# Patient Record
Sex: Male | Born: 1985 | Race: Black or African American | Hispanic: No | Marital: Single | Smoking: Former smoker
Health system: Southern US, Community
[De-identification: ages and names within clinical notes are randomized; demographics above are authoritative.]

## PROBLEM LIST (undated history)

## (undated) DIAGNOSIS — I10 Essential (primary) hypertension: Secondary | ICD-10-CM

---

## 2018-06-04 ENCOUNTER — Encounter: Payer: Self-pay | Admitting: Emergency Medicine

## 2018-06-04 ENCOUNTER — Emergency Department
Admission: EM | Admit: 2018-06-04 | Discharge: 2018-06-04 | Disposition: A | Discharge status: Home / Self Care | Payer: PRIVATE HEALTH INSURANCE | Attending: Emergency Medicine | Admitting: Emergency Medicine

## 2018-06-04 ENCOUNTER — Other Ambulatory Visit: Payer: Self-pay

## 2018-06-04 DIAGNOSIS — Z87891 Personal history of nicotine dependence: Secondary | ICD-10-CM | POA: Insufficient documentation

## 2018-06-04 DIAGNOSIS — L235 Allergic contact dermatitis due to other chemical products: Secondary | ICD-10-CM | POA: Insufficient documentation

## 2018-06-04 DIAGNOSIS — R21 Rash and other nonspecific skin eruption: Secondary | ICD-10-CM | POA: Diagnosis present

## 2018-06-04 DIAGNOSIS — L299 Pruritus, unspecified: Secondary | ICD-10-CM | POA: Diagnosis not present

## 2018-06-04 MED ORDER — HYDROXYZINE HCL 50 MG PO TABS: 50.0000 mg | ORAL_TABLET | Freq: Three times a day (TID) | ORAL | 0 refills | Status: DC | PRN

## 2018-06-04 MED ORDER — METHYLPREDNISOLONE 4 MG PO TBPK: ORAL_TABLET | ORAL | 0 refills | Status: DC

## 2018-06-04 MED ORDER — PREDNISONE 20 MG PO TABS
60.0000 mg | ORAL_TABLET | Freq: Once | ORAL | Status: AC
  Administered 2018-06-04: 60 mg via ORAL
  Filled 2018-06-04: qty 3

## 2018-06-04 MED ORDER — HYDROXYZINE HCL 50 MG PO TABS
50.0000 mg | ORAL_TABLET | Freq: Once | ORAL | Status: AC
  Administered 2018-06-04: 50 mg via ORAL
  Filled 2018-06-04: qty 1

## 2018-06-04 NOTE — ED Triage Notes (Signed)
PT arrives with concerns over possible allergic reaction. Pt denies throat tightness or abnormality. Pt reports taking benadryl this morning at 1000. Visible swelling noted to pt's face. Rash observed to pt's arms. PT reports right ear pain. Symptoms started yesterday per pt.

## 2018-06-04 NOTE — ED Provider Notes (Signed)
[facility] Emergency Department Provider Note   ____________________________________________   First MD Initiated Contact with Patient 06/04/18 1308     (approximate)  I have reviewed the triage vital signs and the nursing notes.   HISTORY  Chief Complaint Allergic Reaction    HPI Malik Keller is a 33 y.o. male patient presents with diffuse rash which started yesterday.  Patient state he changed laundry products last week.  Patient state intense itching with the rash.  Patient denies signs/symptoms of angioedema.  Patient states took Benadryl this morning with only mild relief.  Patient denies pain just itching.  Patient rates his discomfort a 7/10.  No other palliative measure for complaint.   History reviewed. No pertinent past medical history.  There are no active problems to display for this patient.   History reviewed. No pertinent surgical history.  Prior to Admission medications   Medication Sig Start Date End Date Taking? Authorizing Provider  hydrOXYzine (ATARAX/VISTARIL) 50 MG tablet Take 1 tablet (50 mg total) by mouth 3 (three) times daily as needed. 06/04/18   Joni Reining, PA-C  methylPREDNISolone (MEDROL DOSEPAK) 4 MG TBPK tablet Take Tapered dose as directed 06/04/18   Joni Reining, PA-C    Allergies Patient has no allergy information on record.  No family history on file.  Social History Social History   Tobacco Use  . Smoking status: Former Smoker  Substance Use Topics  . Alcohol use: Never    Frequency: Never  . Drug use: Not on file    Review of Systems Constitutional: No fever/chills Eyes: No visual changes. ENT: No sore throat. Cardiovascular: Denies chest pain. Respiratory: Denies shortness of breath. Gastrointestinal: No abdominal pain.  No nausea, no vomiting.  No diarrhea.  No constipation. Genitourinary: Negative for dysuria. Musculoskeletal: Negative for back pain. Skin: Positive for  rash. Neurological: Negative for headaches, focal weakness or numbness.   ____________________________________________   PHYSICAL EXAM:  VITAL SIGNS: ED Triage Vitals  Enc Vitals Group     BP 06/04/18 1247 (!) 143/80     Pulse Rate 06/04/18 1247 75     Resp --      Temp 06/04/18 1247 97.9 F (36.6 C)     Temp Source 06/04/18 1247 Oral     SpO2 06/04/18 1247 99 %     Weight 06/04/18 1247 185 lb (83.9 kg)     Height 06/04/18 1247 5\' 11"  (1.803 m)     Head Circumference --      Peak Flow --      Pain Score 06/04/18 1251 7     Pain Loc --      Pain Edu? --      Excl. in GC? --    Constitutional: Alert and oriented. Well appearing and in no acute distress. Eyes: Conjunctivae are normal. PERRL. EOMI. Head: Atraumatic. Nose: No congestion/rhinnorhea. Mouth/Throat: Mucous membranes are moist.  Oropharynx non-erythematous.  No oral edema. Neck: No stridor.  Hematological/Lymphatic/Immunilogical: No cervical lymphadenopathy. Cardiovascular: Normal rate, regular rhythm. Grossly normal heart sounds.  Good peripheral circulation. Respiratory: Normal respiratory effort.  No retractions. Lungs CTAB. Neurologic:  Normal speech and language. No gross focal neurologic deficits are appreciated. No gait instability. Skin:  Skin is warm, dry and intact.  Diffuse macular lesions. Psychiatric: Mood and affect are normal. Speech and behavior are normal.  ____________________________________________   LABS (all labs ordered are listed, but only abnormal results are displayed)  Labs Reviewed - No data to display ____________________________________________  EKG   ____________________________________________  RADIOLOGY  ED MD interpretation:    Official radiology report(s): No results found.  ____________________________________________   PROCEDURES  Procedure(s) performed (including Critical Care):  Procedures   ____________________________________________   INITIAL  IMPRESSION / ASSESSMENT AND PLAN / ED COURSE  As part of my medical decision making, I reviewed the following data within the electronic MEDICAL RECORD NUMBER     Patient presents with diffuse rash.  Patient given prednisone and Atarax.  Patient given discharge care instructions and advised return back to ED if condition worsens.      ____________________________________________   FINAL CLINICAL IMPRESSION(S) / ED DIAGNOSES  Final diagnoses:  Allergic dermatitis due to other chemical product     ED Discharge Orders         Ordered    methylPREDNISolone (MEDROL DOSEPAK) 4 MG TBPK tablet     06/04/18 1320    hydrOXYzine (ATARAX/VISTARIL) 50 MG tablet  3 times daily PRN     06/04/18 1320           Note:  This document was prepared using Dragon voice recognition software and may include unintentional dictation errors.    Joni Reining, PA-C 06/04/18 1325    Sharman Cheek, MD 06/04/18 1340

## 2018-06-04 NOTE — ED Notes (Signed)
Pt discharged home after verbalizing understanding of discharge instructions; nad noted. 

## 2018-06-04 NOTE — ED Notes (Signed)
See triage note  Presents with rash and itching for the past 2 days   States he used new detergent prior to rash starting  Also having bilateral ear pain afebrile on arrival

## 2018-06-04 NOTE — ED Notes (Signed)
MD stafford seeing pt in triage

## 2019-01-25 ENCOUNTER — Ambulatory Visit: Payer: PRIVATE HEALTH INSURANCE | Admitting: Family Medicine

## 2019-02-01 ENCOUNTER — Ambulatory Visit: Payer: PRIVATE HEALTH INSURANCE | Admitting: Family Medicine

## 2019-09-03 ENCOUNTER — Encounter: Payer: Self-pay | Admitting: Emergency Medicine

## 2019-09-03 ENCOUNTER — Other Ambulatory Visit: Payer: Self-pay

## 2019-09-03 DIAGNOSIS — R42 Dizziness and giddiness: Secondary | ICD-10-CM | POA: Insufficient documentation

## 2019-09-03 DIAGNOSIS — F17210 Nicotine dependence, cigarettes, uncomplicated: Secondary | ICD-10-CM | POA: Diagnosis not present

## 2019-09-03 LAB — CBC
HCT: 43.6 % (ref 39.0–52.0)
Hemoglobin: 14.6 g/dL (ref 13.0–17.0)
MCH: 29.2 pg (ref 26.0–34.0)
MCHC: 33.5 g/dL (ref 30.0–36.0)
MCV: 87.2 fL (ref 80.0–100.0)
Platelets: 308 10*3/uL (ref 150–400)
RBC: 5 MIL/uL (ref 4.22–5.81)
RDW: 13.3 % (ref 11.5–15.5)
WBC: 10.2 10*3/uL (ref 4.0–10.5)
nRBC: 0 % (ref 0.0–0.2)

## 2019-09-03 LAB — BASIC METABOLIC PANEL
Anion gap: 10 (ref 5–15)
BUN: 8 mg/dL (ref 6–20)
CO2: 22 mmol/L (ref 22–32)
Calcium: 9.8 mg/dL (ref 8.9–10.3)
Chloride: 104 mmol/L (ref 98–111)
Creatinine, Ser: 0.81 mg/dL (ref 0.61–1.24)
GFR calc Af Amer: >60 mL/min (ref >60)
GFR calc non Af Amer: >60 mL/min (ref >60)
Glucose, Bld: 102 mg/dL — ABNORMAL HIGH (ref 70–99)
Potassium: 4 mmol/L (ref 3.5–5.1)
Sodium: 136 mmol/L (ref 135–145)

## 2019-09-03 LAB — URINALYSIS, COMPLETE (UACMP) WITH MICROSCOPIC
Bacteria, UA: NONE SEEN
Bilirubin Urine: NEGATIVE
Glucose, UA: NEGATIVE mg/dL
Hgb urine dipstick: NEGATIVE
Ketones, ur: 20 mg/dL — AB
Leukocytes,Ua: NEGATIVE
Nitrite: NEGATIVE
Protein, ur: NEGATIVE mg/dL
Specific Gravity, Urine: 1.021 (ref 1.005–1.030)
Squamous Epithelial / HPF: NONE SEEN (ref 0–5)
pH: 6 (ref 5.0–8.0)

## 2019-09-03 NOTE — ED Notes (Signed)
Patient to stat desk in no acute distress asking about wait time. Patient given update on wait time. Patient verbalizes understanding.  

## 2019-09-03 NOTE — ED Triage Notes (Signed)
Patient reports while driving to work, he became very dizzy and had to pull to the side of the road. Reports after calming down he was able to finish the drive to work. States he has had intermittent dizziness throughout the day as well as tingling in both hands. Patient also reports nausea. Denies CP or SOB. Denies headache. Denies recent illness.

## 2019-09-03 NOTE — ED Triage Notes (Signed)
Patient states he thinks he has been having panic attacks today. Reports his wife has been sick recently so he has been worried about her. Denies history of same.

## 2019-09-04 ENCOUNTER — Emergency Department
Admission: EM | Admit: 2019-09-04 | Discharge: 2019-09-04 | Disposition: A | Discharge status: Home / Self Care | Payer: 59 | Source: Home / Self Care | Attending: Emergency Medicine | Admitting: Emergency Medicine

## 2019-09-04 ENCOUNTER — Encounter: Payer: Self-pay | Admitting: Emergency Medicine

## 2019-09-04 ENCOUNTER — Other Ambulatory Visit: Payer: Self-pay

## 2019-09-04 ENCOUNTER — Emergency Department
Admission: EM | Admit: 2019-09-04 | Discharge: 2019-09-04 | Disposition: A | Discharge status: Home / Self Care | Payer: 59 | Attending: Emergency Medicine | Admitting: Emergency Medicine

## 2019-09-04 DIAGNOSIS — R42 Dizziness and giddiness: Secondary | ICD-10-CM

## 2019-09-04 LAB — TROPONIN I (HIGH SENSITIVITY)
Troponin I (High Sensitivity): 3 ng/L (ref <18)
Troponin I (High Sensitivity): 2 ng/L (ref <18)

## 2019-09-04 NOTE — ED Provider Notes (Signed)
[facility] Emergency Department Provider Note   ____________________________________________    I have reviewed the triage vital signs and the nursing notes.   HISTORY  Chief Complaint Dizziness     HPI Malik Keller is a 34 y.o. male who presents with complaints of dizziness.  Patient has for 5 episodes over the last 6 months where he is typically driving to work thinking about his day and starts perseverating about the things he has to do, develops sweating in his hands then a feeling of anxiety followed by dizziness.  No chest pain or palpitations.  Currently feels well has no symptoms.  Admits to working 6, 12-hour shifts at work for quite some time.  Does admit to significant stress and anxiety about this.  He thinks that he is having anxiety reactions  History reviewed. No pertinent past medical history.  There are no problems to display for this patient.   History reviewed. No pertinent surgical history.  Prior to Admission medications   Medication Sig Start Date End Date Taking? Authorizing Provider  hydrOXYzine (ATARAX/VISTARIL) 50 MG tablet Take 1 tablet (50 mg total) by mouth 3 (three) times daily as needed. 06/04/18   Joni Reining, PA-C  methylPREDNISolone (MEDROL DOSEPAK) 4 MG TBPK tablet Take Tapered dose as directed 06/04/18   Joni Reining, PA-C     Allergies Patient has no known allergies.  No family history on file.  Social History Social History   Tobacco Use  . Smoking status: Current Every Day Smoker    Packs/day: 0.50  . Smokeless tobacco: Never Used  Substance Use Topics  . Alcohol use: Never  . Drug use: Yes    Types: Marijuana    Review of Systems  Constitutional: No fever/chills Eyes: No visual changes.  ENT: No sore throat. Cardiovascular: Denies chest pain. Respiratory: Denies shortness of breath. Gastrointestinal: No abdominal pain.   Genitourinary: Negative for dysuria. Musculoskeletal: Negative  for back pain. Skin: Negative for rash. Neurological: Negative for headaches   ____________________________________________   PHYSICAL EXAM:  VITAL SIGNS: ED Triage Vitals  Enc Vitals Group     BP 09/04/19 0313 136/86     Pulse Rate 09/04/19 0313 79     Resp 09/04/19 0313 20     Temp 09/04/19 0313 98.3 F (36.8 C)     Temp Source 09/04/19 0313 Oral     SpO2 09/04/19 0313 99 %     Weight 09/04/19 0317 88.5 kg (195 lb 1.7 oz)     Height 09/04/19 0317 1.803 m (5\' 11" )     Head Circumference --      Peak Flow --      Pain Score 09/04/19 0317 0     Pain Loc --      Pain Edu? --      Excl. in GC? --     Constitutional: Alert and oriented.   Nose: No congestion/rhinnorhea. Mouth/Throat: Mucous membranes are moist.    Cardiovascular: Normal rate, regular rhythm.  Good peripheral circulation. Respiratory: Normal respiratory effort.  No retractions.  Genitourinary: deferred Musculoskeletal: .  Warm and well perfused Neurologic:  Normal speech and language. No gross focal neurologic deficits are appreciated.  Skin:  Skin is warm, dry and intact. No rash noted. Psychiatric: Mood and affect are normal. Speech and behavior are normal.  ____________________________________________   LABS (all labs ordered are listed, but only abnormal results are displayed)  Labs Reviewed  TROPONIN I (HIGH SENSITIVITY)  TROPONIN I (  HIGH SENSITIVITY)   ____________________________________________  EKG  ED ECG REPORT I, Lavonia Drafts, the attending physician, personally viewed and interpreted this ECG.  Date: 09/04/2019  Rhythm: normal sinus rhythm QRS Axis: normal Intervals: normal ST/T Wave abnormalities: normal Narrative Interpretation: no evidence of acute ischemia  ____________________________________________  RADIOLOGY  None________________________________________   PROCEDURES  Procedure(s) performed: No  Procedures   Critical Care performed: No  ____________________________________________   INITIAL IMPRESSION / ASSESSMENT AND PLAN / ED COURSE  Pertinent labs & imaging results that were available during my care of the patient were reviewed by me and considered in my medical decision making (see chart for details).  Patient well-appearing in no acute distress, asymptomatic here in the emergency department.  Differential includes anxiety reaction, arrhythmia, vasovagal, EKG is reassuring, negative troponin x2.  Reports multiple episodes of this over the last several months, always on the way to work, never happens outside of the car prior to work.  Does seem consistent with anxiety reaction.  Discussed stress relief options cutting back work, etc.    ____________________________________________   FINAL CLINICAL IMPRESSION(S) / ED DIAGNOSES  Final diagnoses:  Dizziness        Note:  This document was prepared using Dragon voice recognition software and may include unintentional dictation errors.   Lavonia Drafts, MD 09/04/19 [phone]

## 2019-09-04 NOTE — ED Notes (Signed)
Pt ambulatory to Stat desk stating that he will come back in the AM. Pt ambulatory out of ED with steady gait and in NAD.

## 2019-09-04 NOTE — ED Triage Notes (Signed)
Pt arrives ambulatory to triage with c/o dizziness. Pt was here earlier and LWBS due to wait time. Pt states that he "just wasn't able to go to sleep" so he came back. Pt is in NAD.

## 2019-09-04 NOTE — ED Notes (Signed)
ED Provider at bedside. 

## 2019-09-08 ENCOUNTER — Encounter: Payer: Self-pay | Admitting: Emergency Medicine

## 2019-09-08 ENCOUNTER — Other Ambulatory Visit: Payer: Self-pay

## 2019-09-08 ENCOUNTER — Emergency Department
Admission: EM | Admit: 2019-09-08 | Discharge: 2019-09-08 | Disposition: A | Discharge status: Home / Self Care | Payer: 59 | Attending: Student in an Organized Health Care Education/Training Program | Admitting: Student in an Organized Health Care Education/Training Program

## 2019-09-08 DIAGNOSIS — Z733 Stress, not elsewhere classified: Secondary | ICD-10-CM | POA: Diagnosis present

## 2019-09-08 DIAGNOSIS — F410 Panic disorder [episodic paroxysmal anxiety] without agoraphobia: Secondary | ICD-10-CM | POA: Insufficient documentation

## 2019-09-08 DIAGNOSIS — F1210 Cannabis abuse, uncomplicated: Secondary | ICD-10-CM | POA: Diagnosis not present

## 2019-09-08 DIAGNOSIS — Z566 Other physical and mental strain related to work: Secondary | ICD-10-CM | POA: Diagnosis not present

## 2019-09-08 DIAGNOSIS — R202 Paresthesia of skin: Secondary | ICD-10-CM | POA: Diagnosis not present

## 2019-09-08 DIAGNOSIS — F17210 Nicotine dependence, cigarettes, uncomplicated: Secondary | ICD-10-CM | POA: Insufficient documentation

## 2019-09-08 LAB — BASIC METABOLIC PANEL
Anion gap: 9 (ref 5–15)
BUN: 8 mg/dL (ref 6–20)
CO2: 26 mmol/L (ref 22–32)
Calcium: 9.4 mg/dL (ref 8.9–10.3)
Chloride: 102 mmol/L (ref 98–111)
Creatinine, Ser: 0.89 mg/dL (ref 0.61–1.24)
GFR calc Af Amer: >60 mL/min (ref >60)
GFR calc non Af Amer: >60 mL/min (ref >60)
Glucose, Bld: 131 mg/dL — ABNORMAL HIGH (ref 70–99)
Potassium: 4 mmol/L (ref 3.5–5.1)
Sodium: 137 mmol/L (ref 135–145)

## 2019-09-08 LAB — CBC
HCT: 43.2 % (ref 39.0–52.0)
Hemoglobin: 14.3 g/dL (ref 13.0–17.0)
MCH: 29.1 pg (ref 26.0–34.0)
MCHC: 33.1 g/dL (ref 30.0–36.0)
MCV: 88 fL (ref 80.0–100.0)
Platelets: 295 10*3/uL (ref 150–400)
RBC: 4.91 MIL/uL (ref 4.22–5.81)
RDW: 13.3 % (ref 11.5–15.5)
WBC: 7.3 10*3/uL (ref 4.0–10.5)
nRBC: 0 % (ref 0.0–0.2)

## 2019-09-08 LAB — TSH: TSH: 0.957 u[IU]/mL (ref 0.350–4.500)

## 2019-09-08 MED ORDER — LORAZEPAM 0.5 MG PO TABS: 0.5000 mg | ORAL_TABLET | Freq: Three times a day (TID) | ORAL | 0 refills | Status: DC | PRN

## 2019-09-08 MED ORDER — LORAZEPAM 1 MG PO TABS
1.0000 mg | ORAL_TABLET | Freq: Once | ORAL | Status: AC
  Administered 2019-09-08: 1 mg via ORAL
  Filled 2019-09-08: qty 1

## 2019-09-08 NOTE — ED Notes (Signed)
Blood work: full rainbow drawn in triage.

## 2019-09-08 NOTE — ED Provider Notes (Signed)
[facility] Emergency Department Provider Note    First MD Initiated Contact with Patient 09/08/19 1214     (approximate)  I have reviewed the triage vital signs and the nursing notes.   HISTORY  Chief Complaint Dizziness and Chills    HPI Malik Keller is a 34 y.o. male presents to the ER for concern over stress and panic attacks.  Was seen for similar symptoms last Friday with reassuring work-up.  States that he is frequently having episodes where he has bilateral tingling overwhelming sense of doom, racing thoughts particular when he is driving home.  States that over the past several nights has been waking up with racing thoughts as well.  Denies any SI or HI.  Denies any history of anxiety or depression does not admit to very stressful work environment.  States his wife is also undergoing quite a bit of stress.  Is not on any sort of medication for this.    History reviewed. No pertinent past medical history. No family history on file. History reviewed. No pertinent surgical history. There are no problems to display for this patient.     Prior to Admission medications   Medication Sig Start Date End Date Taking? Authorizing Provider  LORazepam (ATIVAN) 0.5 MG tablet Take 1 tablet (0.5 mg total) by mouth every 8 (eight) hours as needed for anxiety. 09/08/19 09/07/20  Merlyn Lot, MD    Allergies Patient has no known allergies.    Social History Social History   Tobacco Use  . Smoking status: Current Every Day Smoker    Packs/day: 0.50  . Smokeless tobacco: Never Used  Substance Use Topics  . Alcohol use: Never  . Drug use: Yes    Types: Marijuana    Review of Systems Patient denies headaches, rhinorrhea, blurry vision, numbness, shortness of breath, chest pain, edema, cough, abdominal pain, nausea, vomiting, diarrhea, dysuria, fevers, rashes or hallucinations unless otherwise stated above in  HPI. ____________________________________________   PHYSICAL EXAM:  VITAL SIGNS: Vitals:   09/08/19 0828  BP: 124/86  Pulse: 83  Resp: 20  Temp: 98.9 F (37.2 C)  SpO2: 97%    Constitutional: Alert and oriented.  Eyes: Conjunctivae are normal.  Head: Atraumatic. Nose: No congestion/rhinnorhea. Mouth/Throat: Mucous membranes are moist.   Neck: No stridor. Painless ROM.  Cardiovascular: Normal rate, regular rhythm. Grossly normal heart sounds.  Good peripheral circulation. Respiratory: Normal respiratory effort.  No retractions. Lungs CTAB. Gastrointestinal: Soft and nontender. No distention. No abdominal bruits. No CVA tenderness. Genitourinary:  Musculoskeletal: No lower extremity tenderness nor edema.  No joint effusions. Neurologic:  Normal speech and language. No gross focal neurologic deficits are appreciated. No facial droop Skin:  Skin is warm, dry and intact. No rash noted. Psychiatric: Mood and affect are normal. Speech and behavior are normal.  ____________________________________________   LABS (all labs ordered are listed, but only abnormal results are displayed)  Results for orders placed or performed during the hospital encounter of 09/08/19 (from the past 24 hour(s))  Basic metabolic panel     Status: Abnormal   Collection Time: 09/08/19  8:39 AM  Result Value Ref Range   Sodium 137 135 - 145 mmol/L   Potassium 4.0 3.5 - 5.1 mmol/L   Chloride 102 98 - 111 mmol/L   CO2 26 22 - 32 mmol/L   Glucose, Bld 131 (H) 70 - 99 mg/dL   BUN 8 6 - 20 mg/dL   Creatinine, Ser 0.89 0.61 - 1.24 mg/dL  Calcium 9.4 8.9 - 10.3 mg/dL   GFR calc non Af Amer >60 >60 mL/min   GFR calc Af Amer >60 >60 mL/min   Anion gap 9 5 - 15  CBC     Status: None   Collection Time: 09/08/19  8:39 AM  Result Value Ref Range   WBC 7.3 4.0 - 10.5 K/uL   RBC 4.91 4.22 - 5.81 MIL/uL   Hemoglobin 14.3 13.0 - 17.0 g/dL   HCT 81.4 48.1 - 85.6 %   MCV 88.0 80.0 - 100.0 fL   MCH 29.1 26.0 -  34.0 pg   MCHC 33.1 30.0 - 36.0 g/dL   RDW 31.4 97.0 - 26.3 %   Platelets 295 150 - 400 K/uL   nRBC 0.0 0.0 - 0.2 %  TSH     Status: None   Collection Time: 09/08/19  8:39 AM  Result Value Ref Range   TSH 0.957 0.350 - 4.500 uIU/mL   ____________________________________________  EKG My review and personal interpretation at Time: 8:22   Indication: dizziness  Rate: 65  Rhythm: sinus Axis: normal Other: normal intervals, no stemi ____________________________________________  ____________________________________________   PROCEDURES  Procedure(s) performed:  Procedures    Critical Care performed: no ____________________________________________   INITIAL IMPRESSION / ASSESSMENT AND PLAN / ED COURSE  Pertinent labs & imaging results that were available during my care of the patient were reviewed by me and considered in my medical decision making (see chart for details).   DDX: Adjustment or stress reaction, panic, hypothyroid, electrolyte abnormality  Malik Keller is a 34 y.o. who presents to the ED with symptoms as described above.  Patient is anxious appearing but nontoxic.  Had reassuring work-up just this past Friday.  Denies any pain.  No focal neuro deficits.  Does not seem consistent with vertigo.  Seems to be primarily related to stress at work.  No LOC.  EKG without any evidence of ischemia or dysrhythmia.  Will check thyroid.  Will give Ativan.  We discussed coping mechanisms need for outpatient follow-up.  Will reassess.  Clinical Course as of Sep 08 1318  Wed Sep 08, 2019  1319 With significant improvement after Ativan.  Work-up here is reassuring.  Do feel he is appropriate for outpatient follow-up.  Discussed signs symptoms which he should return to the ER.   [PR]    Clinical Course User Index [PR] Willy Eddy, MD    The patient was evaluated in Emergency Department today for the symptoms described in the history of present illness. He/she was  evaluated in the context of the global COVID-19 pandemic, which necessitated consideration that the patient might be at risk for infection with the SARS-CoV-2 virus that causes COVID-19. Institutional protocols and algorithms that pertain to the evaluation of patients at risk for COVID-19 are in a state of rapid change based on information released by regulatory bodies including the CDC and federal and state organizations. These policies and algorithms were followed during the patient's care in the ED.  As part of my medical decision making, I reviewed the following data within the electronic MEDICAL RECORD NUMBER Nursing notes reviewed and incorporated, Labs reviewed, notes from prior ED visits and [place] Controlled Substance Database   ____________________________________________   FINAL CLINICAL IMPRESSION(S) / ED DIAGNOSES  Final diagnoses:  Stress at work      NEW MEDICATIONS STARTED DURING THIS VISIT:  New Prescriptions   LORAZEPAM (ATIVAN) 0.5 MG TABLET    Take 1 tablet (0.5 mg total)  by mouth every 8 (eight) hours as needed for anxiety.     Note:  This document was prepared using Dragon voice recognition software and may include unintentional dictation errors.    Willy Eddy, MD 09/08/19 1320

## 2019-09-08 NOTE — ED Notes (Signed)
See triage note  States he became dizzy while driving this am   Also had chills and felt like he was going to pass out  States was seen last Saturday for same  Denies any n/v fever or diarrhea

## 2019-09-08 NOTE — ED Triage Notes (Signed)
Pt reports dizziness, chills and feeling like he is going to faint. Pt reports was here for the same a few days ago and was told it was stress related.

## 2019-10-28 ENCOUNTER — Ambulatory Visit: Payer: PRIVATE HEALTH INSURANCE | Admitting: Internal Medicine

## 2019-10-28 DIAGNOSIS — Z0289 Encounter for other administrative examinations: Secondary | ICD-10-CM

## 2019-12-22 ENCOUNTER — Other Ambulatory Visit: Payer: Self-pay

## 2019-12-22 ENCOUNTER — Encounter: Payer: Self-pay | Admitting: *Deleted

## 2019-12-22 ENCOUNTER — Emergency Department
Admission: EM | Admit: 2019-12-22 | Discharge: 2019-12-22 | Disposition: A | Discharge status: Home / Self Care | Payer: 59 | Attending: Emergency Medicine | Admitting: Emergency Medicine

## 2019-12-22 ENCOUNTER — Emergency Department: Payer: 59

## 2019-12-22 DIAGNOSIS — Z20822 Contact with and (suspected) exposure to covid-19: Secondary | ICD-10-CM | POA: Insufficient documentation

## 2019-12-22 DIAGNOSIS — R0602 Shortness of breath: Secondary | ICD-10-CM | POA: Insufficient documentation

## 2019-12-22 DIAGNOSIS — Z87891 Personal history of nicotine dependence: Secondary | ICD-10-CM | POA: Diagnosis not present

## 2019-12-22 DIAGNOSIS — R002 Palpitations: Secondary | ICD-10-CM | POA: Insufficient documentation

## 2019-12-22 LAB — BASIC METABOLIC PANEL
Anion gap: 11 (ref 5–15)
BUN: 13 mg/dL (ref 6–20)
CO2: 24 mmol/L (ref 22–32)
Calcium: 9.7 mg/dL (ref 8.9–10.3)
Chloride: 101 mmol/L (ref 98–111)
Creatinine, Ser: 0.84 mg/dL (ref 0.61–1.24)
GFR calc Af Amer: >60 mL/min (ref >60)
GFR calc non Af Amer: >60 mL/min (ref >60)
Glucose, Bld: 109 mg/dL — ABNORMAL HIGH (ref 70–99)
Potassium: 4 mmol/L (ref 3.5–5.1)
Sodium: 136 mmol/L (ref 135–145)

## 2019-12-22 LAB — TROPONIN I (HIGH SENSITIVITY): Troponin I (High Sensitivity): 4 ng/L (ref <18)

## 2019-12-22 LAB — CBC
HCT: 41.3 % (ref 39.0–52.0)
Hemoglobin: 14 g/dL (ref 13.0–17.0)
MCH: 29.4 pg (ref 26.0–34.0)
MCHC: 33.9 g/dL (ref 30.0–36.0)
MCV: 86.6 fL (ref 80.0–100.0)
Platelets: 294 10*3/uL (ref 150–400)
RBC: 4.77 MIL/uL (ref 4.22–5.81)
RDW: 13.4 % (ref 11.5–15.5)
WBC: 13.8 10*3/uL — ABNORMAL HIGH (ref 4.0–10.5)
nRBC: 0 % (ref 0.0–0.2)

## 2019-12-22 LAB — SARS CORONAVIRUS 2 BY RT PCR (HOSPITAL ORDER, PERFORMED IN ~~LOC~~ HOSPITAL LAB): SARS Coronavirus 2: NEGATIVE

## 2019-12-22 NOTE — ED Provider Notes (Signed)
[facility] Emergency Department Provider Note ____________________________________________   First MD Initiated Contact with Patient 12/22/19 [phone]     (approximate)  I have reviewed the triage vital signs and the nursing notes.  HISTORY  Chief Complaint Shortness of Breath   HPI Malik Keller is a 34 y.o. malewho presents to the ED for evaluation of shortness of breath and palpitations.  Chart review indicates history of anxiety.  Patient reports developing a sensation of shortness of breath on the way to work yesterday morning.  He reports no shortness of breath while working, just a sensation of presyncopal lightheaded dizziness while standing that self resolves.  Reports 3-developing the sensation of shortness of breath on the way home from work last night, so he immediately presented to the ED for evaluation.  Patient has been waiting in the waiting room for approximately 8 hours prior to my evaluation.  Within that timeframe, patient denies any chest pain or shortness of breath.  He reports feeling "okay" right now, "just worried."  Patient denies ever having had chest pain throughout these past few days.  He does report associated heart palpitations with his shortness of breath.  He reports recently receiving the first dose of mRNA COVID-19 vaccination 2 weeks ago, with his third dose upcoming later this week.    No past medical history on file.  There are no problems to display for this patient.   No past surgical history on file.  Prior to Admission medications   Medication Sig Start Date End Date Taking? Authorizing Provider  LORazepam (ATIVAN) 0.5 MG tablet Take 1 tablet (0.5 mg total) by mouth every 8 (eight) hours as needed for anxiety. 09/08/19 09/07/20  Willy Eddy, MD    Allergies Patient has no known allergies.  No family history on file.  Social History Social History   Tobacco Use  . Smoking status: Former Smoker     Packs/day: 0.50  . Smokeless tobacco: Never Used  Substance Use Topics  . Alcohol use: Never  . Drug use: Yes    Types: Marijuana    Review of Systems  Constitutional: No fever/chills Eyes: No visual changes. ENT: No sore throat. Cardiovascular: Denies chest pain.  Positive for palpitations. Respiratory: Positive shortness of breath. Gastrointestinal: No abdominal pain.  No nausea, no vomiting.  No diarrhea.  No constipation. Genitourinary: Negative for dysuria. Musculoskeletal: Negative for back pain. Skin: Negative for rash. Neurological: Negative for headaches, focal weakness or numbness.   ____________________________________________   PHYSICAL EXAM:  VITAL SIGNS: Vitals:   12/22/19 0058 12/22/19 0352  BP: 129/80 132/78  Pulse: 70 (!) 55  Resp: 18 16  Temp: 98.6 F (37 C) 99 F (37.2 C)  SpO2: 99% 99%      Constitutional: Alert and oriented. Well appearing and in no acute distress. Eyes: Conjunctivae are normal. PERRL. EOMI. Head: Atraumatic. Nose: No congestion/rhinnorhea. Mouth/Throat: Mucous membranes are moist.  Oropharynx non-erythematous. Neck: No stridor. No cervical spine tenderness to palpation. Cardiovascular: Normal rate, regular rhythm. Grossly normal heart sounds.  Good peripheral circulation. Respiratory: Normal respiratory effort.  No retractions. Lungs CTAB. Gastrointestinal: Soft , nondistended, nontender to palpation. No abdominal bruits. No CVA tenderness. Musculoskeletal: No lower extremity tenderness nor edema.  No joint effusions. No signs of acute trauma. Neurologic:  Normal speech and language. No gross focal neurologic deficits are appreciated. No gait instability noted. Skin:  Skin is warm, dry and intact. No rash noted. Psychiatric: Mood and affect are normal. Speech and behavior  are normal.  ____________________________________________   LABS (all labs ordered are listed, but only abnormal results are displayed)  Labs Reviewed   BASIC METABOLIC PANEL - Abnormal; Notable for the following components:      Result Value   Glucose, Bld 109 (*)    All other components within normal limits  CBC - Abnormal; Notable for the following components:   WBC 13.8 (*)    All other components within normal limits  SARS CORONAVIRUS 2 BY RT PCR (HOSPITAL ORDER, PERFORMED IN  HOSPITAL LAB)  TROPONIN I (HIGH SENSITIVITY)   ____________________________________________  12 Lead EKG  Sinus rhythm, rate of 60 bpm, normal axis and intervals.  No evidence of acute ischemia. ____________________________________________  RADIOLOGY  ED MD interpretation: 2 view CXR reviewed without evidence of acute cardiopulmonary pathology.  Official radiology report(s): DG Chest 2 View  Result Date: 12/22/2019 CLINICAL DATA:  Shortness of breath EXAM: CHEST - 2 VIEW COMPARISON:  None. FINDINGS: The heart size and mediastinal contours are within normal limits. Both lungs are clear. The visualized skeletal structures are unremarkable. IMPRESSION: No active cardiopulmonary disease. Electronically Signed   By: Jasmine Pang M.D.   On: 12/22/2019 01:45   ____________________________________________   MDM / ED COURSE  Otherwise healthy 34 year old male with a history of anxiety presenting with resolved subjective sensation of shortness of breath, without evidence of acute pathology, and amenable to outpatient management.  Normal vital signs on room air.  Exam without evidence of acute derangements.  Patient looks well, has no acute distress, signs of trauma or neurovascular deficits.  He is asymptomatic here in the ED.  Unremarkable blood work.  CXR without infiltrates or changes with viral infection.  EKG is nonischemic and troponin is negative.  He has already had his first dose of COVID-19 vaccination, though is concerned over the possibility of infection prior to full vaccination.  Due to this, patient was swabbed and advised to check his  results at home and quarantine until he has a negative result.  We discussed outpatient management and return precautions for the ED.  Patient medically stable for discharge home.  ____________________________________________   FINAL CLINICAL IMPRESSION(S) / ED DIAGNOSES  Final diagnoses:  SOB (shortness of breath)     ED Discharge Orders    None       Ras Kollman   Note:  This document was prepared using Dragon voice recognition software and may include unintentional dictation errors.   Delton [place], MD 12/22/19 [phone]

## 2019-12-22 NOTE — ED Notes (Signed)
See triage note Presents with some SOB and cough    Low grade temp on arrival

## 2019-12-22 NOTE — Discharge Instructions (Signed)
Please take Tylenol and ibuprofen/Advil for your pain or fever.  It is safe to take them together, or to alternate them every few hours.  Take up to 1000mg  of Tylenol at a time, up to 4 times per day.  Do not take more than 4000 mg of Tylenol in 24 hours.  For ibuprofen, take 400-600 mg, 4-5 times per day.  Please see the associated instructions on how to set up a MyChart account to review your Covid test results.  If you are negative for COVID-19 on yourr swab, please get the second vaccine as scheduled. If you are positive, please stay at home quarantined and wear a mask around others until you are feeling better, once testing negative then get your second shot.  Return to the ED with any worsening symptoms.

## 2019-12-22 NOTE — ED Triage Notes (Signed)
Pt reports sob since this am.  No cough or fever.  No chest pain.  No n/v/d   Pt reports a dry mouth.  Pt alert.

## 2020-02-27 DIAGNOSIS — R0602 Shortness of breath: Secondary | ICD-10-CM | POA: Insufficient documentation

## 2020-02-27 DIAGNOSIS — R002 Palpitations: Secondary | ICD-10-CM | POA: Insufficient documentation

## 2020-02-27 NOTE — Progress Notes (Signed)
Cardiology Office Note  Date:  02/28/2020   ID:  Malik Keller, DOB 12/28/1985, MRN [id number]  PCP:  Pcp, No   Chief Complaint  Patient presents with  . New Patient (Initial Visit)    Self-referral for chest pain  States he has been having CP for 4-5 months--feels like heart burn; previous provider believes it is anxiety. Medications do not seem to be working. States CP lasts sometimes all day, on left side of chest. States burning pain is accompanied by SOB and numbness in left arm and leg--went to ED for this. They said it was stress related. Pt states he is not really dealing with anything stressful right now. States he feels his heart racing occasionally.    HPI:  Malik Keller is a 34 year old gentleman with past medical history of Anxiety Recently seen in the emergency room for shortness of breath and palpitations Referred by the emergency room for shortness of breath  Multiple trips to the emergency room for anxiety, before episodes of severe anxiety leading to ER evaluation in 2021 Most recently December 22, 2019 seen in the emergency room Numbness in his left arm, was short of breath, Symptoms were better by the time he was evaluated in the emergency room  Each of his visits to the ER labs are normal including cardiac enzymes, EKG unchanged  Left side numb while driving to work Pulled over, sx better in 25 min Also some palpitations, Went to to work, GERD  Off work couple days Checked into "mood clinic" Details unavailable, started on numerous medications for anxiety  Reports when he has an anxiety attack he takes clonazepam and propranolol Does not check his blood pressure routinely Has had high blood pressure in the setting of anxiety, takes amlodipine losartan propranolol  EKG personally reviewed by myself on todays visit Shows normal sinus rhythm with rate 63 bpm no significant ST-T wave changes  PMH:   has no past medical history on file.  PSH:   No  past surgical history on file.  Current Outpatient Medications  Medication Sig Dispense Refill  . amLODipine (NORVASC) 5 MG tablet Take 5 mg by mouth daily.    Marland Kitchen buPROPion (WELLBUTRIN) 75 MG tablet Take 75 mg by mouth 2 (two) times daily.    . busPIRone (BUSPAR) 5 MG tablet Take 5 mg by mouth 2 (two) times daily.    . clindamycin (CLEOCIN T) 1 % external solution Apply topically in the morning and at bedtime.    . clonazePAM (KLONOPIN) 0.5 MG tablet Take 0.5 mg by mouth 2 (two) times daily as needed for anxiety.    Marland Kitchen doxycycline (VIBRA-TABS) 100 MG tablet Take by mouth in the morning, at noon, and at bedtime.    . fluocinonide (LIDEX) 0.05 % external solution Apply topically in the morning and at bedtime.    Marland Kitchen FLUoxetine (PROZAC) 40 MG capsule Take 40 mg by mouth daily.    Marland Kitchen losartan (COZAAR) 50 MG tablet Take 50 mg by mouth daily.    . meclizine (ANTIVERT) 25 MG tablet Take 25 mg by mouth as needed for dizziness.    . mirtazapine (REMERON) 30 MG tablet Take 30 mg by mouth at bedtime.    Marland Kitchen omeprazole (PRILOSEC) 40 MG capsule Take 40 mg by mouth daily.    . propranolol (INDERAL) 20 MG tablet Take 20 mg by mouth 3 (three) times daily.    . traZODone (DESYREL) 50 MG tablet Take 50 mg by mouth at bedtime.  No current facility-administered medications for this visit.     Allergies:   Patient has no known allergies.   Social History:  The patient  reports that he has quit smoking. He smoked 0.50 packs per day. He has never used smokeless tobacco. He reports current drug use. Drug: Marijuana. He reports that he does not drink alcohol.   Family History:   family history is not on file.    Review of Systems: Review of Systems  Constitutional: Negative.   HENT: Negative.   Respiratory: Negative.   Cardiovascular: Negative.   Gastrointestinal: Negative.   Musculoskeletal: Negative.   Neurological: Negative.   Psychiatric/Behavioral: Negative.   All other systems reviewed and are  negative.   PHYSICAL EXAM: VS:  BP 108/72   Pulse 63   Ht 5\' 11"  (1.803 m)   Wt 192 lb (87.1 kg)   BMI 26.78 kg/m  , BMI Body mass index is 26.78 kg/m. GEN: Well nourished, well developed, in no acute distress HEENT: normal Neck: no JVD, carotid bruits, or masses Cardiac: RRR; no murmurs, rubs, or gallops,no edema  Respiratory:  clear to auscultation bilaterally, normal work of breathing GI: soft, nontender, nondistended, + BS MS: no deformity or atrophy Skin: warm and dry, no rash Neuro:  Strength and sensation are intact Psych: euthymic mood, full affect   Recent Labs: 09/08/2019: TSH 0.957 12/22/2019: BUN 13; Creatinine, Ser 0.84; Hemoglobin 14.0; Platelets 294; Potassium 4.0; Sodium 136    Lipid Panel No results found for: CHOL, HDL, LDLCALC, TRIG    Wt Readings from Last 3 Encounters:  02/28/20 192 lb (87.1 kg)  12/22/19 182 lb (82.6 kg)  09/08/19 195 lb (88.5 kg)       ASSESSMENT AND PLAN:  Problem List Items Addressed This Visit    SOB (shortness of breath)   Palpitations     Palpitations Secondary to anxiety, We did discuss ZIO monitor if symptoms get worse He does report dramatic improvement in his symptoms on his current anxiety regiment which also includes clonazepam and propranolol He feels comfortable waiting on the monitor for now but will call 11/08/19 if symptoms get worse  Shortness of breath Reports he is able to exercise several miles at a time on his home exercise machine with no symptoms Shortness of breath symptoms in the setting of anxiety He has had improved with anxiety medications No further work-up at this time though we did discuss echocardiogram if symptoms get worse    Total encounter time more than 25 minutes  Greater than 50% was spent in counseling and coordination of care with the patient    Signed, [place], M.D., Ph.D. [facility] [place], [place]

## 2020-02-28 ENCOUNTER — Ambulatory Visit (INDEPENDENT_AMBULATORY_CARE_PROVIDER_SITE_OTHER): Payer: 59 | Admitting: Cardiovascular Disease

## 2020-02-28 ENCOUNTER — Encounter: Payer: Self-pay | Admitting: Cardiovascular Disease

## 2020-02-28 ENCOUNTER — Other Ambulatory Visit: Payer: Self-pay

## 2020-02-28 VITALS — BP 108/72 | HR 63 | Ht 71.0 in | Wt 192.0 lb

## 2020-02-28 DIAGNOSIS — R0602 Shortness of breath: Secondary | ICD-10-CM

## 2020-02-28 DIAGNOSIS — R002 Palpitations: Secondary | ICD-10-CM

## 2020-02-28 DIAGNOSIS — F419 Anxiety disorder, unspecified: Secondary | ICD-10-CM

## 2020-02-28 NOTE — Patient Instructions (Addendum)
If rhythm gets bad, call the office We can order a ZIO monitor And maybe echocardiogram  Medication Instructions:  For heart burn, take tums and pepcid as needed   If you need a refill on your cardiac medications before your next appointment, please call your pharmacy.    Lab work: No new labs needed   If you have labs (blood work) drawn today and your tests are completely normal, you will receive your results only by: Marland Kitchen MyChart Message (if you have MyChart) OR . A paper copy in the mail If you have any lab test that is abnormal or we need to change your treatment, we will call you to review the results.   Testing/Procedures: No new testing needed   Follow-Up: At [facility], you and your health needs are our priority.  As part of our continuing mission to provide you with exceptional heart care, we have created designated Provider Care Teams.  These Care Teams include your primary Cardiologist (physician) and Advanced Practice Providers (APPs -  Physician Assistants and Nurse Practitioners) who all work together to provide you with the care you need, when you need it.  . You will need a follow up appointment as needed  . Providers on your designated Care Team:   . Nicolasa Ducking, NP . Eula Listen, PA-C . Marisue Ivan, PA-C  Any Other Special Instructions Will Be Listed Below (If Applicable).  COVID-19 Vaccine Information can be found at: [email or web address] For questions related to vaccine distribution or appointments, please email [email or web address] or call [phone].

## 2020-04-02 ENCOUNTER — Other Ambulatory Visit: Payer: Self-pay

## 2020-04-02 ENCOUNTER — Encounter: Payer: Self-pay | Admitting: Emergency Medicine

## 2020-04-02 ENCOUNTER — Emergency Department
Admission: EM | Admit: 2020-04-02 | Discharge: 2020-04-02 | Disposition: A | Discharge status: Home / Self Care | Payer: PRIVATE HEALTH INSURANCE | Attending: Emergency Medicine | Admitting: Emergency Medicine

## 2020-04-02 DIAGNOSIS — Z79899 Other long term (current) drug therapy: Secondary | ICD-10-CM | POA: Insufficient documentation

## 2020-04-02 DIAGNOSIS — Z87891 Personal history of nicotine dependence: Secondary | ICD-10-CM | POA: Insufficient documentation

## 2020-04-02 DIAGNOSIS — I10 Essential (primary) hypertension: Secondary | ICD-10-CM | POA: Insufficient documentation

## 2020-04-02 HISTORY — DX: Essential (primary) hypertension: I10

## 2020-04-02 NOTE — ED Provider Notes (Signed)
[facility] Emergency Department Provider Note  ____________________________________________   Event Date/Time   First MD Initiated Contact with Patient 04/02/20 1050     (approximate)  I have reviewed the triage vital signs and the nursing notes.   HISTORY  Chief Complaint Hypertension    HPI Malik Keller is a 34 y.o. male presents emergency department with concerns of hypertension for 2 days.  Patient states that his blood pressure was 166/109 prior to taking his medication this morning.  He also admits to eating pork ribs the other night.  Patient also picked up his son 2 days ago.  He denies chest pain or shortness of breath this time.  He does have a cardiologist and follows up with him regularly.    Past Medical History:  Diagnosis Date  . Hypertension     Patient Active Problem List   Diagnosis Date Noted  . SOB (shortness of breath) 02/27/2020  . Palpitations 02/27/2020    History reviewed. No pertinent surgical history.  Prior to Admission medications   Medication Sig Start Date End Date Taking? Authorizing Provider  amLODipine (NORVASC) 5 MG tablet Take 5 mg by mouth daily. 02/22/20   [provider]  buPROPion (WELLBUTRIN) 75 MG tablet Take 75 mg by mouth 2 (two) times daily.    [provider]  busPIRone (BUSPAR) 5 MG tablet Take 5 mg by mouth 2 (two) times daily. 02/21/20   [provider]  clindamycin (CLEOCIN T) 1 % external solution Apply topically in the morning and at bedtime. 02/16/20   [provider]  clonazePAM (KLONOPIN) 0.5 MG tablet Take 0.5 mg by mouth 2 (two) times daily as needed for anxiety.    [provider]  doxycycline (VIBRA-TABS) 100 MG tablet Take by mouth in the morning, at noon, and at bedtime. 02/16/20   [provider]  fluocinonide (LIDEX) 0.05 % external solution Apply topically in the morning and at bedtime. 02/16/20   [provider]   FLUoxetine (PROZAC) 40 MG capsule Take 40 mg by mouth daily.    [provider]  losartan (COZAAR) 50 MG tablet Take 50 mg by mouth daily. 02/22/20   [provider]  meclizine (ANTIVERT) 25 MG tablet Take 25 mg by mouth as needed for dizziness.    [provider]  mirtazapine (REMERON) 30 MG tablet Take 30 mg by mouth at bedtime. 02/14/20   [provider]  omeprazole (PRILOSEC) 40 MG capsule Take 40 mg by mouth daily. 02/15/20   [provider]  propranolol (INDERAL) 20 MG tablet Take 20 mg by mouth 3 (three) times daily. 02/22/20   [provider]  traZODone (DESYREL) 50 MG tablet Take 50 mg by mouth at bedtime. 02/13/20   [provider]    Allergies Patient has no known allergies.  No family history on file.  Social History Social History   Tobacco Use  . Smoking status: Former Smoker    Packs/day: 0.50  . Smokeless tobacco: Never Used  Substance Use Topics  . Alcohol use: Never  . Drug use: Yes    Types: Marijuana    Review of Systems  Constitutional: No fever/chills Eyes: No visual changes. ENT: No sore throat. Respiratory: Denies cough Cardiovascular: Denies chest pain Gastrointestinal: Denies abdominal pain Genitourinary: Negative for dysuria. Musculoskeletal: Negative for back pain. Skin: Negative for rash. Psychiatric: no mood changes,     ____________________________________________   PHYSICAL EXAM:  VITAL SIGNS: ED Triage Vitals  Enc  Vitals Group     BP 04/02/20 1003 (!) 145/91     Pulse Rate 04/02/20 1003 64     Resp 04/02/20 1003 16     Temp 04/02/20 1003 98.7 F (37.1 C)     Temp Source 04/02/20 1003 Oral     SpO2 04/02/20 1003 98 %     Weight 04/02/20 0955 180 lb (81.6 kg)     Height 04/02/20 0955 5\' 11"  (1.803 m)     Head Circumference --      Peak Flow --      Pain Score 04/02/20 0954 0     Pain Loc --      Pain Edu? --      Excl. in GC? --     Constitutional: Alert and  oriented. Well appearing and in no acute distress. Eyes: Conjunctivae are normal.  Head: Atraumatic. Nose: No congestion/rhinnorhea. Mouth/Throat: Mucous membranes are moist.   Neck:  supple no lymphadenopathy noted Cardiovascular: Normal rate, regular rhythm. Heart sounds are normal Respiratory: Normal respiratory effort.  No retractions, lungs c t a  GU: deferred Musculoskeletal: FROM all extremities, warm and well perfused Neurologic:  Normal speech and language.  Skin:  Skin is warm, dry and intact. No rash noted. Psychiatric: Mood and affect are normal. Speech and behavior are normal.  ____________________________________________   LABS (all labs ordered are listed, but only abnormal results are displayed)  Labs Reviewed - No data to display ____________________________________________   ____________________________________________  RADIOLOGY    ____________________________________________   PROCEDURES  Procedure(s) performed: No  Procedures    ____________________________________________   INITIAL IMPRESSION / ASSESSMENT AND PLAN / ED COURSE  Pertinent labs & imaging results that were available during my care of the patient were reviewed by me and considered in my medical decision making (see chart for details).   34 year old male presents with concerns of hypertension.  See HPI.  Physical exam is unremarkable.  Blood pressure is minimally elevated.  Long discussion with the patient about stress, anxiety, and food choices.  All of these can increase his blood pressure.  He is to follow-up with his regular doctor.  Return emergency department worsening.  Is discharged stable condition.     Malik Keller was evaluated in Emergency Department on 04/02/2020 for the symptoms described in the history of present illness. He was evaluated in the context of the global COVID-19 pandemic, which necessitated consideration that the patient might be at risk for infection  with the SARS-CoV-2 virus that causes COVID-19. Institutional protocols and algorithms that pertain to the evaluation of patients at risk for COVID-19 are in a state of rapid change based on information released by regulatory bodies including the CDC and federal and state organizations. These policies and algorithms were followed during the patient's care in the ED.    As part of my medical decision making, I reviewed the following data within the electronic MEDICAL RECORD NUMBER Nursing notes reviewed and incorporated, Old chart reviewed, Notes from prior ED visits and [place] Controlled Substance Database  ____________________________________________   FINAL CLINICAL IMPRESSION(S) / ED DIAGNOSES  Final diagnoses:  Hypertension, unspecified type      NEW MEDICATIONS STARTED DURING THIS VISIT:  New Prescriptions   No medications on file     Note:  This document was prepared using Dragon voice recognition software and may include unintentional dictation errors.    04/04/2020, PA-C 04/02/20 1149    04/04/20, MD 04/02/20 1500

## 2020-04-02 NOTE — Discharge Instructions (Addendum)
Avoid pork and salt, take your medication as prescribed, follow up with your regular doctor if not better in 3 days, return if worsening

## 2020-04-02 NOTE — ED Notes (Signed)
Pt reports HTN x2 days with associated weakness - Denies any other symptoms

## 2020-04-02 NOTE — ED Triage Notes (Signed)
Pt reports his blood pressure has been since yesterday. Pt reports hx of HTN and takes meds but last night it was 166/109. Denies pain, SOB or other sx's.

## 2022-05-09 IMAGING — CR DG CHEST 2V
2 series · 2 of 2 positions shown · non-contrast
Comparison: None.

CLINICAL DATA: Shortness of breath

EXAM:
CHEST - 2 VIEW

[chest pa]
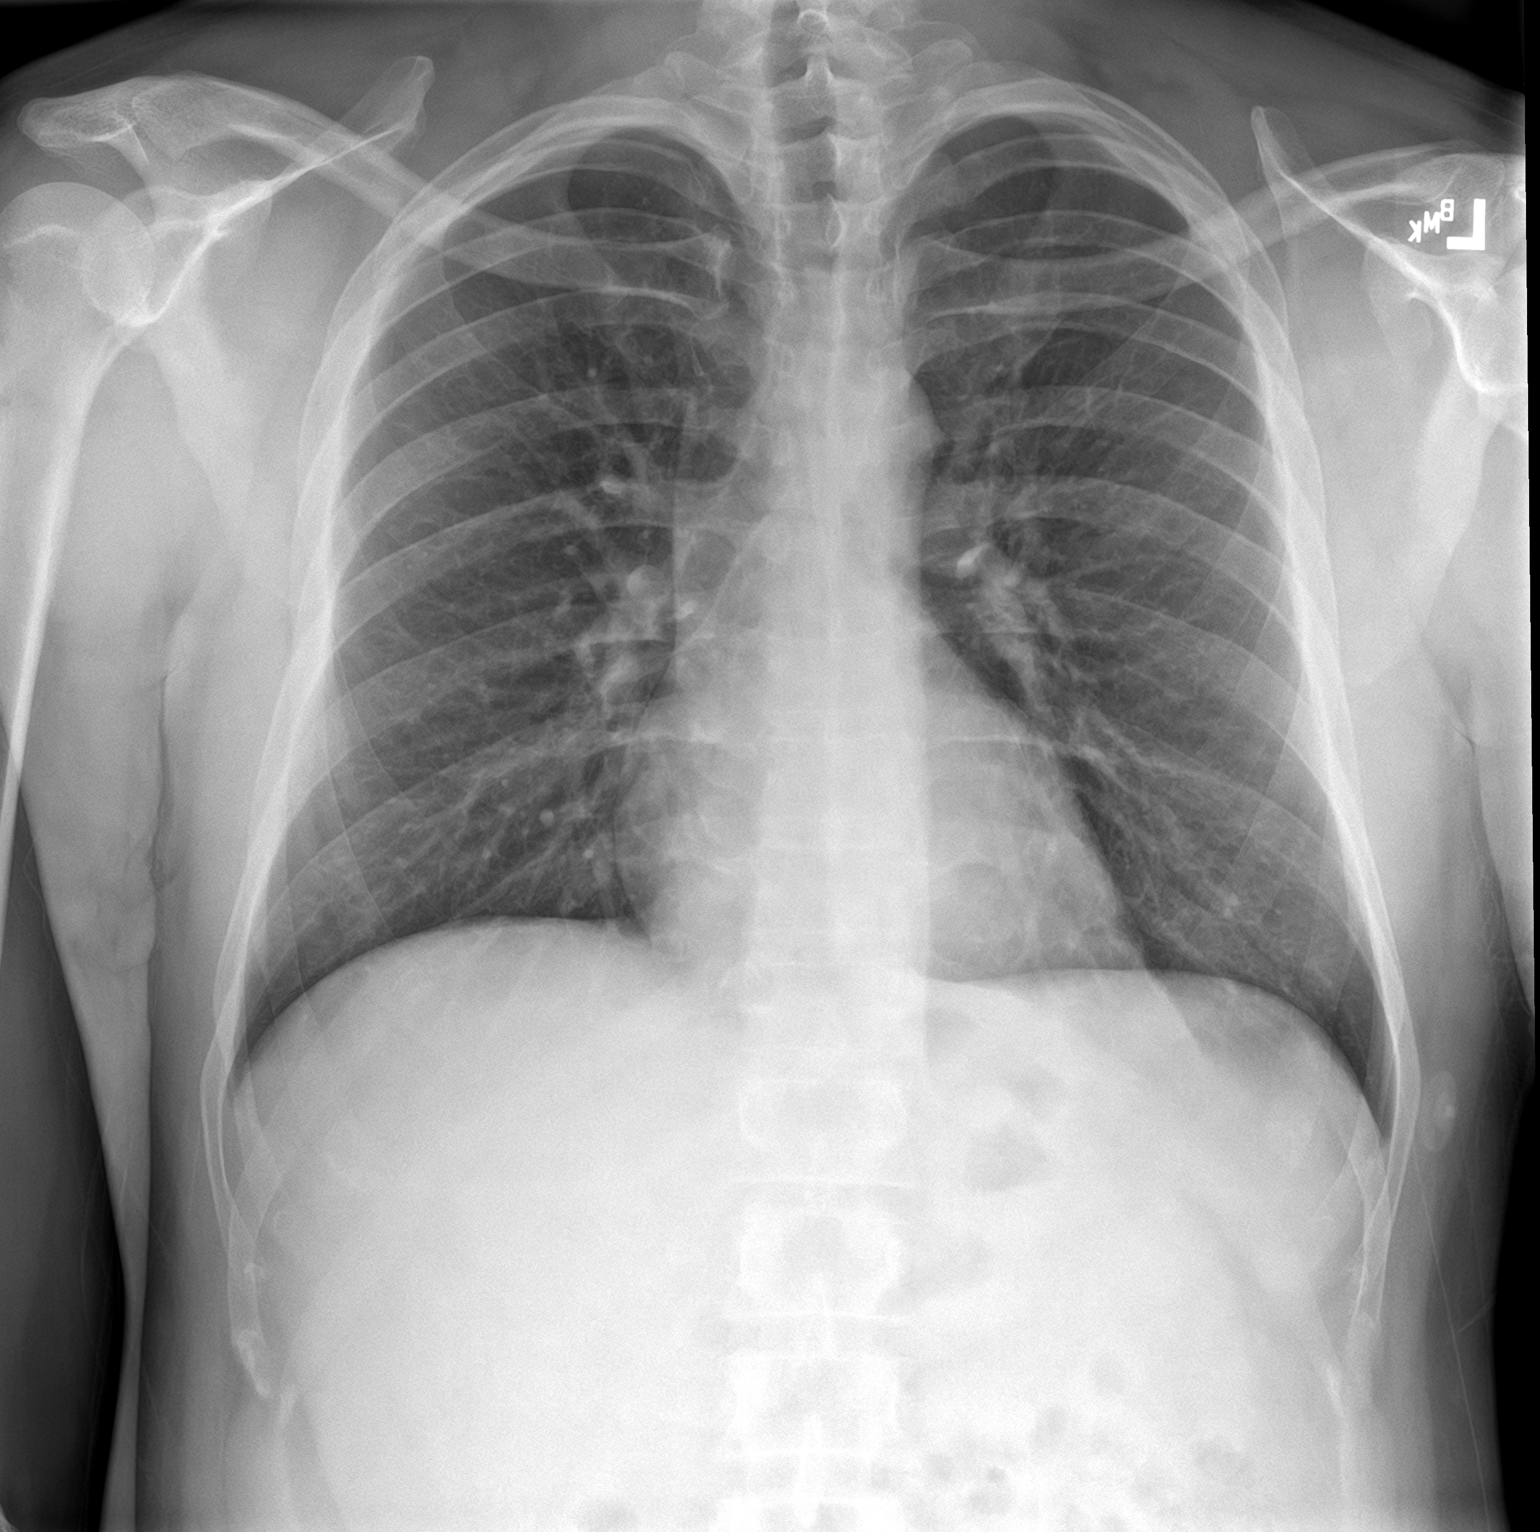

[chest lat]
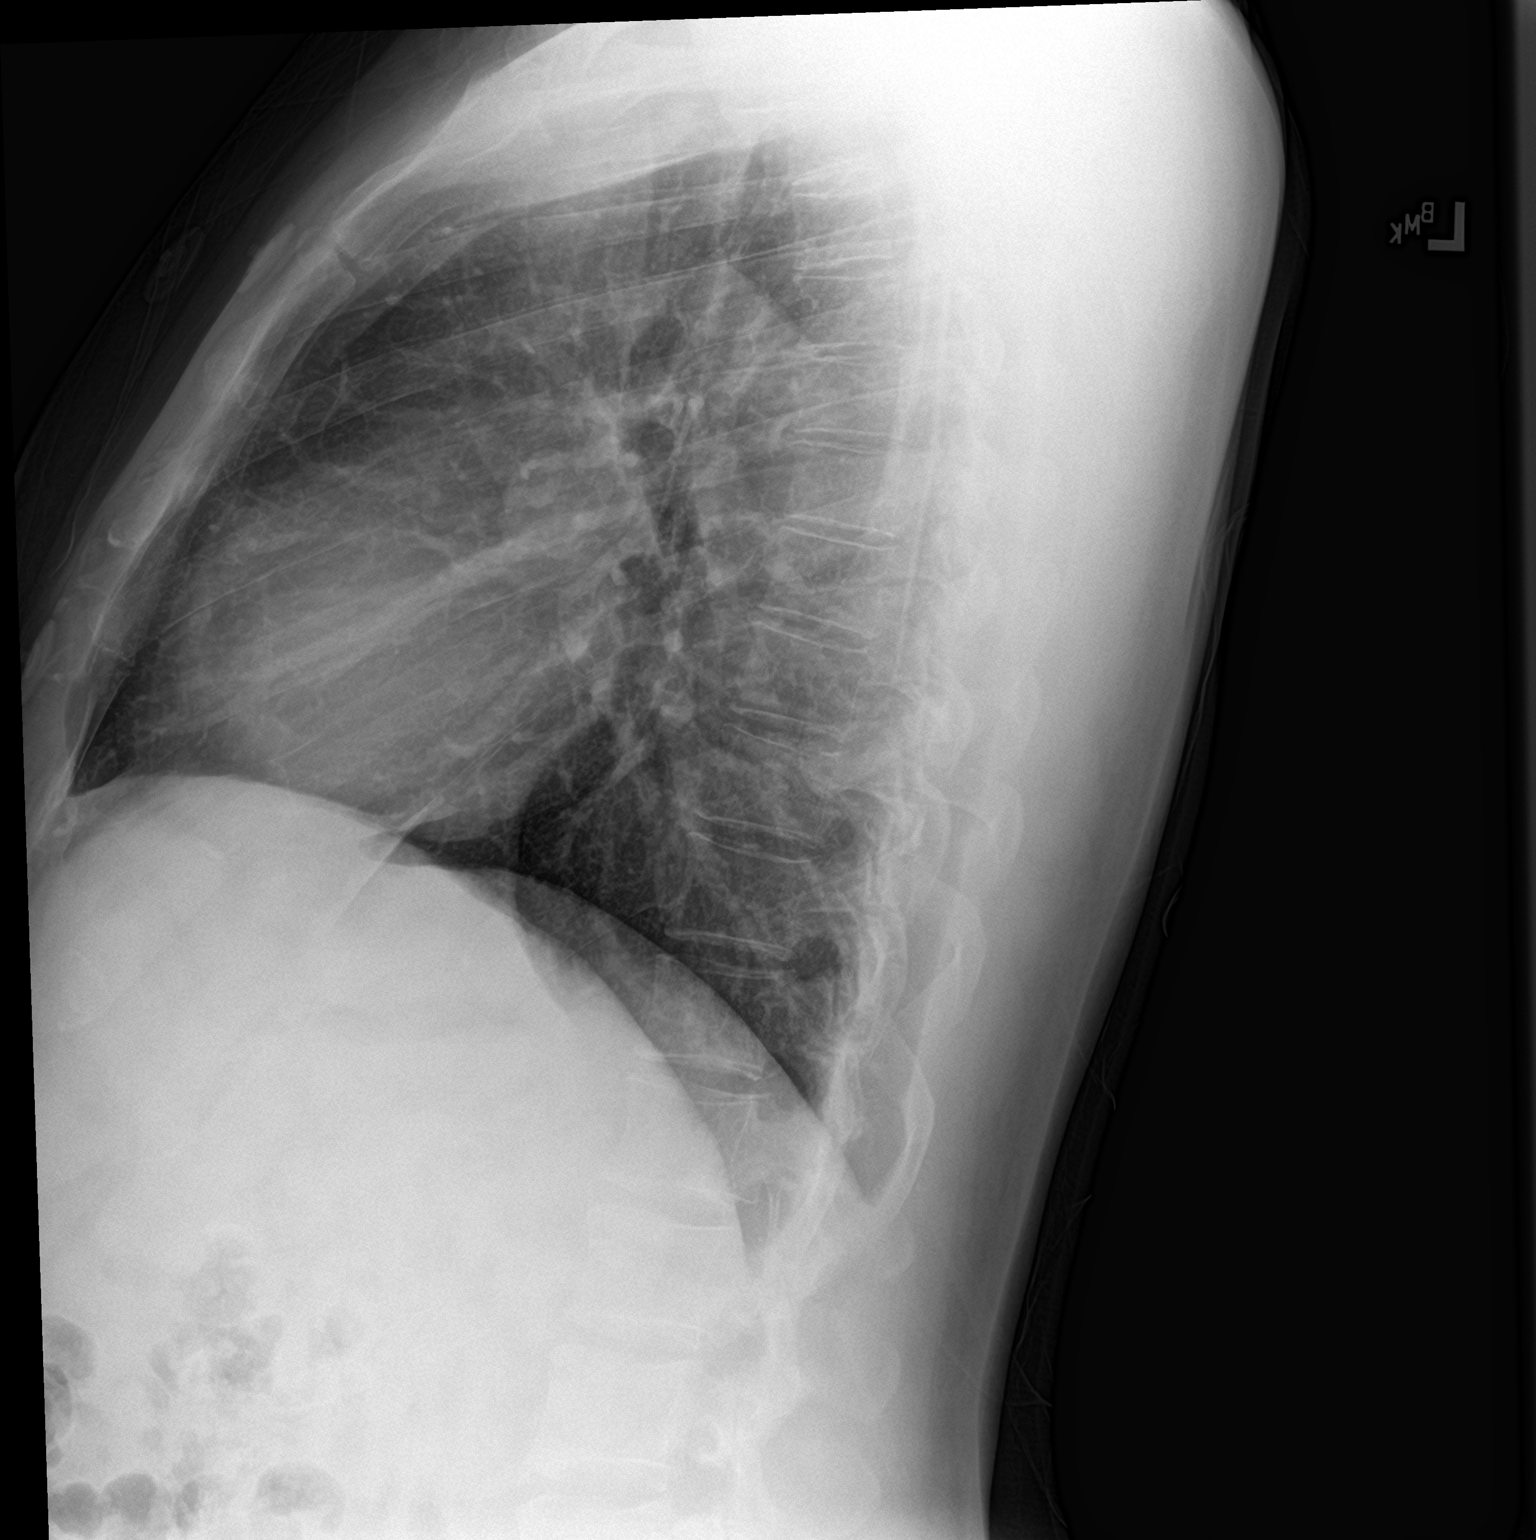

[2 of 2 positions shown; findings below may reference images not displayed]

FINDINGS: The heart size and mediastinal contours are within normal limits.
Both lungs are clear. The visualized skeletal structures are
unremarkable.
IMPRESSION: No active cardiopulmonary disease.

## 2022-12-08 NOTE — Progress Notes (Deleted)
New patient visit  Patient: Malik Keller   DOB: 12-Apr-1985   37 y.o. Male  MRN: [id number] Visit Date: 12/10/2022  Today's healthcare provider: Debera Lat, PA-C   No chief complaint on file.  Subjective    Malik Keller is a 37 y.o. male who presents today as a new patient to establish care.  HPI  *** Discussed the use of AI scribe software for clinical note transcription with the patient, who gave verbal consent to proceed.  History of Present Illness            Past Medical History:  Diagnosis Date   Hypertension    No past surgical history on file. No family status information on file.   No family history on file. Social History   Socioeconomic History   Marital status: Single    Spouse name: Not on file   Number of children: Not on file   Years of education: Not on file   Highest education level: Not on file  Occupational History   Not on file  Tobacco Use   Smoking status: Former    Current packs/day: 0.50    Types: Cigarettes   Smokeless tobacco: Never  Substance and Sexual Activity   Alcohol use: Never   Drug use: Yes    Types: Marijuana   Sexual activity: Not on file  Other Topics Concern   Not on file  Social History Narrative   Not on file   Social Determinants of Health   Financial Resource Strain: Not on file  Food Insecurity: Not on file  Transportation Needs: Not on file  Physical Activity: Not on file  Stress: Not on file  Social Connections: Not on file   Outpatient Medications Prior to Visit  Medication Sig   amLODipine (NORVASC) 5 MG tablet Take 5 mg by mouth daily.   buPROPion (WELLBUTRIN) 75 MG tablet Take 75 mg by mouth 2 (two) times daily.   busPIRone (BUSPAR) 5 MG tablet Take 5 mg by mouth 2 (two) times daily.   clindamycin (CLEOCIN T) 1 % external solution Apply topically in the morning and at bedtime.   clonazePAM (KLONOPIN) 0.5 MG tablet Take 0.5 mg by mouth 2 (two) times daily as needed for anxiety.   doxycycline  (VIBRA-TABS) 100 MG tablet Take by mouth in the morning, at noon, and at bedtime.   fluocinonide (LIDEX) 0.05 % external solution Apply topically in the morning and at bedtime.   FLUoxetine (PROZAC) 40 MG capsule Take 40 mg by mouth daily.   losartan (COZAAR) 50 MG tablet Take 50 mg by mouth daily.   meclizine (ANTIVERT) 25 MG tablet Take 25 mg by mouth as needed for dizziness.   mirtazapine (REMERON) 30 MG tablet Take 30 mg by mouth at bedtime.   omeprazole (PRILOSEC) 40 MG capsule Take 40 mg by mouth daily.   propranolol (INDERAL) 20 MG tablet Take 20 mg by mouth 3 (three) times daily.   traZODone (DESYREL) 50 MG tablet Take 50 mg by mouth at bedtime.   No facility-administered medications prior to visit.   No Known Allergies   There is no immunization history on file for this patient.  Health Maintenance  Topic Date Due   HIV Screening  Never done   Hepatitis C Screening  Never done   DTaP/Tdap/Td (1 - Tdap) Never done   INFLUENZA VACCINE  Never done   COVID-19 Vaccine (1 - 2023-24 season) Never done   HPV VACCINES  Aged Out  Patient Care Team: Pcp, No as PCP - General  Review of Systems  All other systems reviewed and are negative.  Except see HPI   {Insert previous labs (optional):23779} {See past labs  Heme  Chem  Endocrine  Serology  Results Review (optional):1}   Objective    There were no vitals taken for this visit. {Insert last BP/Wt (optional):23777}{See vitals history (optional):1}   Physical Exam Vitals reviewed.  Constitutional:      General: He is not in acute distress.    Appearance: Normal appearance. He is not diaphoretic.  HENT:     Head: Normocephalic and atraumatic.  Eyes:     General: No scleral icterus.    Conjunctiva/sclera: Conjunctivae normal.  Cardiovascular:     Rate and Rhythm: Normal rate and regular rhythm.     Pulses: Normal pulses.     Heart sounds: Normal heart sounds. No murmur heard. Pulmonary:     Effort:  Pulmonary effort is normal. No respiratory distress.     Breath sounds: Normal breath sounds. No wheezing or rhonchi.  Musculoskeletal:     Cervical back: Neck supple.     Right lower leg: No edema.     Left lower leg: No edema.  Lymphadenopathy:     Cervical: No cervical adenopathy.  Skin:    General: Skin is warm and dry.     Findings: No rash.  Neurological:     Mental Status: He is alert and oriented to person, place, and time. Mental status is at baseline.  Psychiatric:        Mood and Affect: Mood normal.        Behavior: Behavior normal.     Depression Screen     No data to display         No results found for any visits on 12/10/22.  Assessment & Plan              Encounter to establish care Welcomed to our clinic Reviewed past medical hx, social hx, family hx and surgical hx Pt advised to send all vaccination records or screening   No follow-ups on file.    The patient was advised to call back or seek an in-person evaluation if the symptoms worsen or if the condition fails to improve as anticipated.  I discussed the assessment and treatment plan with the patient. The patient was provided an opportunity to ask questions and all were answered. The patient agreed with the plan and demonstrated an understanding of the instructions.  I, Debera Lat, PA-C have reviewed all documentation for this visit. The documentation on 11/09/22 for the exam, diagnosis, procedures, and orders are all accurate and complete.  Debera Lat, [facility], MMS [facility] [phone] (phone) [phone] (fax)  [facility]

## 2022-12-10 ENCOUNTER — Ambulatory Visit: Payer: Self-pay | Admitting: Physician Assistant

## 2022-12-10 DIAGNOSIS — F419 Anxiety disorder, unspecified: Secondary | ICD-10-CM

## 2022-12-10 DIAGNOSIS — I10 Essential (primary) hypertension: Secondary | ICD-10-CM

## 2022-12-10 DIAGNOSIS — Z7689 Persons encountering health services in other specified circumstances: Secondary | ICD-10-CM

## 2023-02-09 NOTE — Progress Notes (Unsigned)
Patient was not seen for appt d/t no call, no show, or late arrival >10 mins past appt time.    Debera Lat PA [facility] [address] [place], [place] [address] [phone] (phone) [phone] (fax) [facility]

## 2023-02-11 ENCOUNTER — Ambulatory Visit (INDEPENDENT_AMBULATORY_CARE_PROVIDER_SITE_OTHER): Payer: Self-pay | Admitting: Physician Assistant

## 2023-02-11 DIAGNOSIS — Z91199 Patient's noncompliance with other medical treatment and regimen due to unspecified reason: Secondary | ICD-10-CM

## 2023-02-11 DIAGNOSIS — Z7689 Persons encountering health services in other specified circumstances: Secondary | ICD-10-CM

## 2023-09-29 ENCOUNTER — Ambulatory Visit: Payer: Self-pay | Admitting: General Practice

## 2023-11-25 ENCOUNTER — Ambulatory Visit: Payer: Self-pay | Admitting: General Practice

## 2023-11-25 DIAGNOSIS — Z7689 Persons encountering health services in other specified circumstances: Secondary | ICD-10-CM

## 2024-01-16 ENCOUNTER — Ambulatory Visit: Payer: Self-pay | Admitting: General Practice
